# Patient Record
Sex: Male | Born: 1995 | Race: White | Hispanic: No | Marital: Single
Health system: Southern US, Community
[De-identification: ages and names within clinical notes are randomized; demographics above are authoritative.]

---
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
## 2019-08-23 ENCOUNTER — Other Ambulatory Visit: Payer: Self-pay

## 2019-08-23 DIAGNOSIS — Z20822 Contact with and (suspected) exposure to covid-19: Secondary | ICD-10-CM

## 2019-08-24 ENCOUNTER — Ambulatory Visit: Payer: Self-pay | Admitting: *Deleted

## 2019-08-24 LAB — NOVEL CORONAVIRUS, NAA: SARS-CoV-2, NAA: DETECTED — AB

## 2019-08-24 NOTE — Telephone Encounter (Signed)
Pt called and notified of positive COVID-19 results. See results note.
